# Patient Record
Sex: Male | Born: 1990 | Race: White | Hispanic: No | Marital: Single | Smoking: Current every day smoker
Health system: Southern US, Community
[De-identification: ages and names within clinical notes are randomized; demographics above are authoritative.]

---
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
## 2015-12-20 ENCOUNTER — Encounter (HOSPITAL_COMMUNITY): Payer: Self-pay | Admitting: Emergency Medicine

## 2015-12-20 ENCOUNTER — Emergency Department (HOSPITAL_COMMUNITY)
Admission: EM | Admit: 2015-12-20 | Discharge: 2015-12-20 | Disposition: A | Discharge status: Home / Self Care | Payer: 59 | Attending: Physician Assistant | Admitting: Physician Assistant

## 2015-12-20 ENCOUNTER — Emergency Department (HOSPITAL_COMMUNITY): Payer: 59

## 2015-12-20 DIAGNOSIS — Y998 Other external cause status: Secondary | ICD-10-CM | POA: Diagnosis not present

## 2015-12-20 DIAGNOSIS — Z880 Allergy status to penicillin: Secondary | ICD-10-CM | POA: Insufficient documentation

## 2015-12-20 DIAGNOSIS — Y92410 Unspecified street and highway as the place of occurrence of the external cause: Secondary | ICD-10-CM | POA: Insufficient documentation

## 2015-12-20 DIAGNOSIS — S8011XA Contusion of right lower leg, initial encounter: Secondary | ICD-10-CM

## 2015-12-20 DIAGNOSIS — S6991XA Unspecified injury of right wrist, hand and finger(s), initial encounter: Secondary | ICD-10-CM | POA: Diagnosis present

## 2015-12-20 DIAGNOSIS — S40021A Contusion of right upper arm, initial encounter: Secondary | ICD-10-CM

## 2015-12-20 DIAGNOSIS — S40011A Contusion of right shoulder, initial encounter: Secondary | ICD-10-CM | POA: Diagnosis not present

## 2015-12-20 DIAGNOSIS — IMO0001 Reserved for inherently not codable concepts without codable children: Secondary | ICD-10-CM

## 2015-12-20 DIAGNOSIS — Y9389 Activity, other specified: Secondary | ICD-10-CM | POA: Insufficient documentation

## 2015-12-20 MED ORDER — CYCLOBENZAPRINE HCL 10 MG PO TABS: 10.0000 mg | ORAL_TABLET | Freq: Three times a day (TID) | ORAL | Status: DC | PRN

## 2015-12-20 MED ORDER — DICLOFENAC SODIUM 75 MG PO TBEC: 75.0000 mg | DELAYED_RELEASE_TABLET | Freq: Two times a day (BID) | ORAL | Status: DC
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
## 2017-09-07 ENCOUNTER — Emergency Department (HOSPITAL_COMMUNITY)
Admission: EM | Admit: 2017-09-07 | Discharge: 2017-09-07 | Disposition: A | Discharge status: Home / Self Care | Payer: BLUE CROSS/BLUE SHIELD | Attending: Emergency Medicine | Admitting: Emergency Medicine

## 2017-09-07 ENCOUNTER — Encounter (HOSPITAL_COMMUNITY): Payer: Self-pay | Admitting: Emergency Medicine

## 2017-09-07 DIAGNOSIS — Y929 Unspecified place or not applicable: Secondary | ICD-10-CM | POA: Diagnosis not present

## 2017-09-07 DIAGNOSIS — Y998 Other external cause status: Secondary | ICD-10-CM | POA: Insufficient documentation

## 2017-09-07 DIAGNOSIS — F17210 Nicotine dependence, cigarettes, uncomplicated: Secondary | ICD-10-CM | POA: Insufficient documentation

## 2017-09-07 DIAGNOSIS — Z23 Encounter for immunization: Secondary | ICD-10-CM | POA: Insufficient documentation

## 2017-09-07 DIAGNOSIS — Y9389 Activity, other specified: Secondary | ICD-10-CM | POA: Diagnosis not present

## 2017-09-07 DIAGNOSIS — S61213A Laceration without foreign body of left middle finger without damage to nail, initial encounter: Secondary | ICD-10-CM | POA: Insufficient documentation

## 2017-09-07 DIAGNOSIS — Y289XXA Contact with unspecified sharp object, undetermined intent, initial encounter: Secondary | ICD-10-CM | POA: Insufficient documentation

## 2017-09-07 DIAGNOSIS — Z79899 Other long term (current) drug therapy: Secondary | ICD-10-CM | POA: Diagnosis not present

## 2017-09-07 MED ORDER — TETANUS-DIPHTH-ACELL PERTUSSIS 5-2.5-18.5 LF-MCG/0.5 IM SUSP
0.5000 mL | Freq: Once | INTRAMUSCULAR | Status: AC
  Administered 2017-09-07: 0.5 mL via INTRAMUSCULAR
  Filled 2017-09-07: qty 0.5

## 2017-09-07 MED ORDER — HYDROCODONE-ACETAMINOPHEN 5-325 MG PO TABS: 1.0000 | ORAL_TABLET | ORAL | 0 refills | Status: AC | PRN

## 2017-09-07 MED ORDER — ONDANSETRON HCL 4 MG PO TABS
4.0000 mg | ORAL_TABLET | Freq: Once | ORAL | Status: AC
  Administered 2017-09-07: 4 mg via ORAL
  Filled 2017-09-07: qty 1

## 2017-09-07 MED ORDER — HYDROCODONE-ACETAMINOPHEN 5-325 MG PO TABS
2.0000 | ORAL_TABLET | Freq: Once | ORAL | Status: AC
  Administered 2017-09-07: 2 via ORAL
  Filled 2017-09-07: qty 2

## 2017-09-07 MED ORDER — LIDOCAINE HCL (PF) 2 % IJ SOLN
INTRAMUSCULAR | Status: AC
  Administered 2017-09-07
  Filled 2017-09-07: qty 10

## 2017-09-07 NOTE — ED Triage Notes (Signed)
Cut tip of lt middle finger with pocket knife

## 2017-09-07 NOTE — ED Provider Notes (Signed)
[facility]EMER[facility]ENCY DEPT Provider Note   CSN: [id number] Arrival date & time: 09/07/17  2200     History   Chief Complaint Chief Complaint  Patient presents with  . Laceration    HPI Luis Matthews is a 26 y.o. male.  Patient is a 26 year old male who presents to the emergency department with a laceration to the left middle finger.  The patient states he was trying to cut into some battery status is up time on them. The batteries slipped, and the patient cut the left middle finger. He states he was able to get the blood control by applying pressure. No other laceration reported. Patient denies being on any anticoagulation medications. He has no history of bleeding disorders. No previous operations or procedures involving the left middle finger. He presents now for assistance with this problem. The patient is also concerned about the date of his last tetanus.      History reviewed. No pertinent past medical history.  There are no active problems to display for this patient.   History reviewed. No pertinent surgical history.     Home Medications    Prior to Admission medications   Medication Sig Start Date End Date Taking? Authorizing Provider  ALPRAZolam Prudy Feeler) 1 MG tablet Take 1 mg by mouth 4 (four) times daily.   Yes [provider]  amphetamine-dextroamphetamine (ADDERALL) 20 MG tablet Take 20 mg by mouth 4 (four) times daily. 06/24/17  Yes [provider]  HYDROcodone-acetaminophen (NORCO/VICODIN) 5-325 MG tablet Take 1 tablet by mouth every 4 (four) hours as needed. 09/07/17   Ivery Quale, PA-C    Family History No family history on file.  Social History Social History  Substance Use Topics  . Smoking status: Current Every Day Smoker    Packs/day: 0.50  . Smokeless tobacco: Never Used  . Alcohol use No     Allergies   Penicillins   Review of Systems Review of Systems  Constitutional: Negative for activity change.       All ROS Neg  except as noted in HPI  HENT: Negative for nosebleeds.   Eyes: Negative for photophobia and discharge.  Respiratory: Negative for cough, shortness of breath and wheezing.   Cardiovascular: Negative for chest pain and palpitations.  Gastrointestinal: Negative for abdominal pain and blood in stool.  Genitourinary: Negative for dysuria, frequency and hematuria.  Musculoskeletal: Negative for arthralgias, back pain and neck pain.  Skin: Negative.   Neurological: Negative for dizziness, seizures and speech difficulty.  Psychiatric/Behavioral: Negative for confusion and hallucinations.     Physical Exam Updated Vital Signs BP 130/78 (BP Location: Right Arm)   Pulse (!) 104   Temp 98.8 F (37.1 C) (Oral)   Resp 18   Ht  (1.778 m)   Wt 65.8 kg (145 lb)   SpO2 100%   BMI 20.81 kg/m   Physical Exam  Constitutional: He is oriented to person, place, and time. He appears well-developed and well-nourished.  Non-toxic appearance.  HENT:  Head: Normocephalic.  Right Ear: Tympanic membrane and external ear normal.  Left Ear: Tympanic membrane and external ear normal.  Eyes: Pupils are equal, round, and reactive to light. EOM and lids are normal.  Neck: Normal range of motion. Neck supple. Carotid bruit is not present.  Cardiovascular: Normal rate, regular rhythm, normal heart sounds, intact distal pulses and normal pulses.   Pulmonary/Chest: Breath sounds normal. No respiratory distress.  Abdominal: Soft. Bowel sounds are normal. There is no tenderness. There is  no guarding.  Musculoskeletal: Normal range of motion. He exhibits tenderness.  Laceration to the palmar surface of the distal left middle finger. No bone or tendon involvement. Wound measures 1.7 cm.  Lymphadenopathy:       Head (right side): No submandibular adenopathy present.       Head (left side): No submandibular adenopathy present.    He has no cervical adenopathy.  Neurological: He is alert and oriented to person,  place, and time. He has normal strength. No cranial nerve deficit or sensory deficit.  Skin: Skin is warm and dry.  Psychiatric: He has a normal mood and affect. His speech is normal.  Nursing note and vitals reviewed.    ED Treatments / Results  Labs (all labs ordered are listed, but only abnormal results are displayed) Labs Reviewed - No data to display  EKG  EKG Interpretation None       Radiology No results found.  Procedures .Marland KitchenLaceration Repair Date/Time: 09/07/2017 11:37 PM Performed by: Ivery Quale Authorized by: Ivery Quale   Consent:    Consent obtained:  Verbal   Consent given by:  Patient   Risks discussed:  Infection, pain, poor cosmetic result and poor wound healing Anesthesia (see MAR for exact dosages):    Anesthesia method:  Nerve block   Block location:  Middle finger   Block needle gauge:  30 G   Block anesthetic:  Lidocaine 2% w/o epi   Block technique:  Digital   Block injection procedure:  Anatomic landmarks identified, introduced needle, incremental injection and negative aspiration for blood   Block outcome:  Anesthesia achieved Laceration details:    Location:  Finger   Finger location:  L long finger   Length (cm):  1.7 Repair type:    Repair type:  Simple Pre-procedure details:    Preparation:  Patient was prepped and draped in usual sterile fashion Exploration:    Hemostasis achieved with:  Direct pressure   Wound exploration: wound explored through full range of motion     Wound extent: no tendon damage noted and no underlying fracture noted   Treatment:    Area cleansed with:  Soap and water   Amount of cleaning:  Standard   Irrigation solution:  Sterile saline Skin repair:    Repair method:  Sutures   Suture size:  4-0   Wound skin closure material used: vicryl rapide.   Suture technique:  Simple interrupted   Number of sutures:  7 Approximation:    Approximation:  Close Post-procedure details:    Dressing:  Sterile  dressing   Patient tolerance of procedure:  Tolerated well, no immediate complications   (including critical care time)  Medications Ordered in ED Medications  Tdap (BOOSTRIX) injection 0.5 mL (not administered)  HYDROcodone-acetaminophen (NORCO/VICODIN) 5-325 MG per tablet 2 tablet (not administered)  ondansetron (ZOFRAN) tablet 4 mg (not administered)  lidocaine (XYLOCAINE) 2 % injection (  Given by Other 09/07/17 2336)     Initial Impression / Assessment and Plan / ED Course  I have reviewed the triage vital signs and the nursing notes.  Pertinent labs & imaging results that were available during my care of the patient were reviewed by me and considered in my medical decision making (see chart for details).       Final Clinical Impressions(s) / ED Diagnoses MDM Patient sustained laceration to the left middle finger today. The wound was irrigated well and then repaired with 7 interrupted sutures of Vicryl Rapide. I discussed  with the patient the need to return if signs of advancing infection. Also discussed with the patient that these were dissolvable sutures. He will return to the emergency department if any changes, problems, or concerns. Sterile dressing applied to the wound.   Final diagnoses:  Laceration of left middle finger without foreign body without damage to nail, initial encounter    New Prescriptions New Prescriptions   HYDROCODONE-ACETAMINOPHEN (NORCO/VICODIN) 5-325 MG TABLET    Take 1 tablet by mouth every 4 (four) hours as needed.     Ivery Quale, PA-C 09/07/17 2346    Bethann Berkshire, MD 09/08/17 [phone]

## 2017-09-07 NOTE — Discharge Instructions (Signed)
Your vital signs within normal limits. Your laceration was repaired with a dissolvable stitch. These will dissolve in about 7-10 days. Please see your doctor, or return to the emergency department if any increase redness, red streaks going up the hand, pus like material coming between the sutures, or signs of advancing infection. Use Tylenol or ibuprofen for soreness. Use norco for more severe pain.This medication may cause drowsiness. Please do not drink, drive, or participate in activity that requires concentration while taking this medication. It is important that she keep the wound clean and dry.use a bandage, as well as put her hand in a bag with twisting tight rubber band around the wrists if you're in the shower. Please do not submerge your hand in water such as washing dishes or going swimming. Change dressing daily.

## 2022-08-24 DEATH — deceased
